# Patient Record
Sex: Male | Born: 1990 | Race: Black or African American | Hispanic: No | Marital: Single | Smoking: Former smoker
Health system: Southern US, Community
[De-identification: ages and names within clinical notes are randomized; demographics above are authoritative.]

---
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
## 2014-02-03 ENCOUNTER — Emergency Department (HOSPITAL_COMMUNITY)
Admission: EM | Admit: 2014-02-03 | Discharge: 2014-02-04 | Disposition: A | Discharge status: Home / Self Care | Payer: Medicaid Other | Attending: Emergency Medicine | Admitting: Emergency Medicine

## 2014-02-03 ENCOUNTER — Encounter (HOSPITAL_COMMUNITY): Payer: Self-pay | Admitting: *Deleted

## 2014-02-03 DIAGNOSIS — R067 Sneezing: Secondary | ICD-10-CM | POA: Insufficient documentation

## 2014-02-03 DIAGNOSIS — Z720 Tobacco use: Secondary | ICD-10-CM | POA: Insufficient documentation

## 2014-02-03 DIAGNOSIS — R040 Epistaxis: Secondary | ICD-10-CM | POA: Insufficient documentation

## 2014-02-03 DIAGNOSIS — R0981 Nasal congestion: Secondary | ICD-10-CM | POA: Insufficient documentation

## 2014-02-03 NOTE — ED Notes (Signed)
Pt came to Ed in pov. Pt c/o nosebleed. Pt states started around 1000 today and has been intermittently bleeding throughout the day.

## 2014-02-04 MED ORDER — OXYMETAZOLINE HCL 0.05 % NA SOLN
1.0000 | Freq: Once | NASAL | Status: AC
  Administered 2014-02-04: 1 via NASAL
  Filled 2014-02-04: qty 15

## 2014-02-04 NOTE — ED Provider Notes (Signed)
CSN: [id number]     Arrival date & time 02/03/14  2315 History   First MD Initiated Contact with Patient 02/03/14 2356     Chief Complaint  Patient presents with  . Epistaxis    (Consider location/radiation/quality/duration/timing/severity/associated sxs/prior Treatment) Patient is a 23 y.o. male presenting with nosebleeds. The history is provided by the patient. No language interpreter was used.  Epistaxis Location:  L nare Severity:  Moderate Duration:  1 day Timing:  Intermittent Progression:  Waxing and waning Chronicity:  New Context: not anticoagulants, not aspirin use, not bleeding disorder, not foreign body and not nose picking   Context comment:  Recent URI symptoms with frequent nose blowing Relieved by: Tissues held at base of nose. Worsened by:  Sneezing Associated symptoms: blood in oropharynx, congestion and sneezing   Associated symptoms: no cough, no dizziness, no fever, no sinus pain, no sore throat and no syncope     No past medical history on file. No past surgical history on file. No family history on file. History  Substance Use Topics  . Smoking status: Current Every Day Smoker    Types: Cigarettes  . Smokeless tobacco: Never Used  . Alcohol Use: No    Review of Systems  Constitutional: Negative for fever.  HENT: Positive for congestion, nosebleeds and sneezing. Negative for sore throat.   Respiratory: Negative for cough.   Cardiovascular: Negative for syncope.  Neurological: Negative for dizziness.  All other systems reviewed and are negative.   Allergies  Review of patient's allergies indicates no known allergies.  Home Medications   Prior to Admission medications   Not on File   BP 119/76 mmHg  Pulse 85  Temp(Src) 98 F (36.7 C) (Oral)  Resp 12  SpO2 96%   Physical Exam  Constitutional: He is oriented to person, place, and time. He appears well-developed and well-nourished. No distress.  Nontoxic/nonseptic appearing  HENT:   Head: Normocephalic and atraumatic.  Bilateral nares patent. No septal deviation or hematoma. There is mild maceration noted to the medial aspect of the left nare. No active bleeding. Mild amount of blood in oropharynx.  Eyes: Conjunctivae and EOM are normal. No scleral icterus.  Neck: Normal range of motion.  Pulmonary/Chest: Effort normal. No respiratory distress.  Respirations even and unlabored  Musculoskeletal: Normal range of motion.  Neurological: He is alert and oriented to person, place, and time. He exhibits normal muscle tone. Coordination normal.  Skin: Skin is warm and dry. No rash noted. He is not diaphoretic. No erythema. No pallor.  Psychiatric: He has a normal mood and affect. His behavior is normal.  Nursing note and vitals reviewed.   ED Course  Procedures (including critical care time) Labs Review Labs Reviewed - No data to display  Imaging Review No results found.   EKG Interpretation None      MDM   Final diagnoses:  Epistaxis    23 year old male presents to the emergency department for further evaluation of nosebleed. Patient states that he has had multiple nosebleeds over the course of the day. He reports preceding symptoms of nasal congestion and frequent sneezing. Patient has no nosebleed at present. He has been monitored in the ED for over 1 hour with no recurrence of bleed. Physical exam findings as above. No septal deviation or hematoma. Bilateral nares patent. Patient treated with Afrin and has been provided instruction on how to manage future nosebleeds. Will refer to ENT if symptoms persist or worsen. Return precautions provided and patient agreeable  to plan with no unaddressed concerns.   Filed Vitals:   02/03/14 2321 02/04/14 0037  BP: 140/94 119/76  Pulse: 99 85  Temp: 98 F (36.7 C)   TempSrc: Oral   Resp: 14 12  SpO2: 97% 96%     Antony MaduraKelly Finis Hendricksen, PA-C 02/04/14 [id number]  Gerhard Munchobert Lockwood, MD 02/04/14 [phone]

## 2014-02-04 NOTE — Discharge Instructions (Signed)
Recommend that you use Afrin nasal spray once a day for the next 2 days (02/05/14 and 02/06/14). Your first dose was given in the ED today. Follow instructions below for future nosebleeds including 10 minutes of CONSTANT pressure with tilting of the head forward. Follow up with an ENT doctor if symptoms persist or worsen.  Nosebleed Nosebleeds can be caused by many conditions, including trauma, infections, polyps, foreign bodies, dry mucous membranes or climate, medicines, and air conditioning. Most nosebleeds occur in the front of the nose. Because of this location, most nosebleeds can be controlled by pinching the nostrils gently and continuously for at least 10 to 20 minutes. The long, continuous pressure allows enough time for the blood to clot. If pressure is released during that 10 to 20 minute time period, the process may have to be started again. The nosebleed may stop by itself or quit with pressure, or it may need concentrated heating (cautery) or pressure from packing. HOME CARE INSTRUCTIONS   If your nose was packed, try to maintain the pack inside until your health care provider removes it. If a gauze pack was used and it starts to fall out, gently replace it or cut the end off. Do not cut if a balloon catheter was used to pack the nose. Otherwise, do not remove unless instructed.  Avoid blowing your nose for 12 hours after treatment. This could dislodge the pack or clot and start the bleeding again.  If the bleeding starts again, sit up and bend forward, gently pinching the front half of your nose continuously for 20 minutes.  If bleeding was caused by dry mucous membranes, use over-the-counter saline nasal spray or gel. This will keep the mucous membranes moist and allow them to heal. If you must use a lubricant, choose the water-soluble variety. Use it only sparingly and not within several hours of lying down.  Do not use petroleum jelly or mineral oil, as these may drip into the lungs and  cause serious problems.  Maintain humidity in your home by using less air conditioning or by using a humidifier.  Do not use aspirin or medicines which make bleeding more likely. Your health care provider can give you recommendations on this.  Resume normal activities as you are able, but try to avoid straining, lifting, or bending at the waist for several days.  If the nosebleeds become recurrent and the cause is unknown, your health care provider may suggest laboratory tests. SEEK MEDICAL CARE IF: You have a fever. SEEK IMMEDIATE MEDICAL CARE IF:   Bleeding recurs and cannot be controlled.  There is unusual bleeding from or bruising on other parts of the body.  Nosebleeds continue.  There is any worsening of the condition which originally brought you in.  You become light-headed, feel faint, become sweaty, or vomit blood. MAKE SURE YOU:   Understand these instructions.  Will watch your condition.  Will get help right away if you are not doing well or get worse. Document Released: 11/01/2004 Document Revised: 06/08/2013 Document Reviewed: 12/23/2008 [facility] Patient Information 2015 [place], [place]. This information is not intended to replace advice given to you by your health care provider. Make sure you discuss any questions you have with your health care provider.

## 2014-09-30 ENCOUNTER — Encounter (HOSPITAL_COMMUNITY): Payer: Self-pay

## 2014-09-30 ENCOUNTER — Encounter (HOSPITAL_COMMUNITY): Payer: Self-pay | Admitting: Emergency Medicine

## 2014-09-30 ENCOUNTER — Emergency Department (HOSPITAL_COMMUNITY)
Admission: EM | Admit: 2014-09-30 | Discharge: 2014-09-30 | Disposition: A | Discharge status: Home / Self Care | Payer: Medicaid Other | Attending: Emergency Medicine | Admitting: Emergency Medicine

## 2014-09-30 DIAGNOSIS — Z720 Tobacco use: Secondary | ICD-10-CM | POA: Insufficient documentation

## 2014-09-30 DIAGNOSIS — Z87891 Personal history of nicotine dependence: Secondary | ICD-10-CM | POA: Insufficient documentation

## 2014-09-30 DIAGNOSIS — L0231 Cutaneous abscess of buttock: Secondary | ICD-10-CM | POA: Insufficient documentation

## 2014-09-30 MED ORDER — OXYCODONE-ACETAMINOPHEN 5-325 MG PO TABS
1.0000 | ORAL_TABLET | Freq: Once | ORAL | Status: AC
  Administered 2014-09-30: 1 via ORAL
  Filled 2014-09-30: qty 1

## 2014-09-30 MED ORDER — TETANUS-DIPHTH-ACELL PERTUSSIS 5-2.5-18.5 LF-MCG/0.5 IM SUSP
0.5000 mL | Freq: Once | INTRAMUSCULAR | Status: AC
  Administered 2014-09-30: 0.5 mL via INTRAMUSCULAR
  Filled 2014-09-30: qty 0.5

## 2014-09-30 MED ORDER — SULFAMETHOXAZOLE-TRIMETHOPRIM 800-160 MG PO TABS
1.0000 | ORAL_TABLET | Freq: Two times a day (BID) | ORAL | Status: AC
Start: 2014-09-30 — End: 2014-10-07

## 2014-09-30 MED ORDER — OXYCODONE-ACETAMINOPHEN 5-325 MG PO TABS: 1.0000 | ORAL_TABLET | Freq: Four times a day (QID) | ORAL | Status: DC | PRN

## 2014-09-30 MED ORDER — CEPHALEXIN 500 MG PO CAPS: 1000.0000 mg | ORAL_CAPSULE | Freq: Two times a day (BID) | ORAL | Status: DC

## 2014-09-30 MED ORDER — HYDROCODONE-ACETAMINOPHEN 5-325 MG PO TABS
1.0000 | ORAL_TABLET | Freq: Once | ORAL | Status: AC
  Administered 2014-09-30: 1 via ORAL
  Filled 2014-09-30: qty 1

## 2014-09-30 MED ORDER — LIDOCAINE-EPINEPHRINE 2 %-1:100000 IJ SOLN
10.0000 mL | Freq: Once | INTRAMUSCULAR | Status: AC
  Administered 2014-09-30: 10 mL via INTRADERMAL
  Filled 2014-09-30: qty 1

## 2014-09-30 NOTE — ED Notes (Signed)
Pt c/o increasing abscess on bilateral medial buttocks x 5 days.  Pain score 10/10. Redness and swelling noted.

## 2014-09-30 NOTE — ED Notes (Addendum)
Verbalized understanding discharge instructions and follow-up. In no acute distress.  Pt given wound care supplies.    

## 2014-09-30 NOTE — ED Provider Notes (Signed)
CSN: [id number]     Arrival date & time 09/30/14  1121 History   First MD Initiated Contact with Patient 09/30/14 1123     No chief complaint on file.    (Consider location/radiation/quality/duration/timing/severity/associated sxs/prior Treatment) HPI   24 year old male presenting with complaint of pain and swelling to his buttock. Patient noticed increasing pain and swelling to his gluteal cleft approximately 5 days ago. Symptoms progressively getting worse. Described pain as as sharp throbbing sensation, persistent, worsening with sitting. He has tried over-the-counter pain medication with minimal movement. Rates his pain as 10 out of 10. He has never had this problem before. Denies any specific injury. Denies having fever,rectal pain, difficulty having bowel movement aside from the discomfort. He is not up-to-date with tetanus. No prior history of pilonidal cyst.  No past medical history on file. No past surgical history on file. No family history on file. Social History  Substance Use Topics  . Smoking status: Current Every Day Smoker    Types: Cigarettes  . Smokeless tobacco: Never Used  . Alcohol Use: No    Review of Systems  Constitutional: Negative for fever.  Gastrointestinal: Negative for abdominal pain and rectal pain.  Skin: Positive for rash.  Neurological: Negative for numbness.      Allergies  Review of patient's allergies indicates no known allergies.  Home Medications   Prior to Admission medications   Not on File   There were no vitals taken for this visit. Physical Exam  Constitutional: He appears well-developed and well-nourished. No distress.  HENT:  Head: Atraumatic.  Eyes: Conjunctivae are normal.  Neck: Neck supple.  Abdominal: There is no tenderness.  Genitourinary:  Chaperone present during exam.  A large area of induration with fluctuance and surrounding skin erythema noted to gluteal cleft L>R.  Exquisite tenderness to palpation.  No  evidence of forniere gangrene.  No rectal involvement  Neurological: He is alert.  Psychiatric: He has a normal mood and affect.  Nursing note and vitals reviewed.   ED Course  Procedures (including critical care time)  Large gluteal cleft ?pilonidal cyst abscess requiring I&D.  No rectal involvement.  tdap update.  I&D performed by me, pt tolerates well.  INCISION AND DRAINAGE Performed by: Fayrene Helper Consent: Verbal consent obtained. Risks and benefits: risks, benefits and alternatives were discussed Type: abscess  Body area: gluteal cleft (?pilonidal cyst) affecting L buttock more than R.  Anesthesia: local infiltration  Incision was made with a scalpel.  Local anesthetic: lidocaine 2% w epinephrine  Anesthetic total: 8 ml  Complexity: complex Blunt dissection to break up loculations  Drainage: purulent  Drainage amount: large  Packing material: 1/4 in iodoform gauze  Patient tolerance: Patient tolerated the procedure well with no immediate complications.   12:21 PM Pt instruct to use sitz bath, take abx and pain medication as prescribed.  Pt will f/u in 2 days for wound recheck and packing removal.  Pt understand to return promptly if sxs worse, having rectal bleeding, or having any other concerns.  Labs Review Labs Reviewed - No data to display  Imaging Review No results found. I have personally reviewed and evaluated these images and lab results as part of my medical decision-making.   EKG Interpretation None      MDM   Final diagnoses:  Abscess of gluteal cleft    BP 122/76 mmHg  Pulse 91  Temp(Src) 99.4 F (37.4 C) (Oral)  Resp 16  SpO2 96%     Fayrene Helper, PA-C  09/30/14 1225  Azalia Bilis, MD 09/30/14 [phone]

## 2014-09-30 NOTE — ED Notes (Signed)
Per EMS, Pt, from home, c/o buttocks pain after having an abscess drained earlier today.  Pain score 10/10.  Pt reported to EMS that he was unable to get his prescriptions filled.

## 2014-09-30 NOTE — Discharge Instructions (Signed)
Please fill your medications as prescribed previously and take it as recommended.   Abscess Care After An abscess (also called a boil or furuncle) is an infected area that contains a collection of pus. Signs and symptoms of an abscess include pain, tenderness, redness, or hardness, or you may feel a moveable soft area under your skin. An abscess can occur anywhere in the body. The infection may spread to surrounding tissues causing cellulitis. A cut (incision) by the surgeon was made over your abscess and the pus was drained out. Gauze may have been packed into the space to provide a drain that will allow the cavity to heal from the inside outwards. The boil may be painful for 5 to 7 days. Most people with a boil do not have high fevers. Your abscess, if seen early, may not have localized, and may not have been lanced. If not, another appointment may be required for this if it does not get better on its own or with medications. HOME CARE INSTRUCTIONS   Only take over-the-counter or prescription medicines for pain, discomfort, or fever as directed by your caregiver.  When you bathe, soak and then remove gauze or iodoform packs at least daily or as directed by your caregiver. You may then wash the wound gently with mild soapy water. Repack with gauze or do as your caregiver directs. SEEK IMMEDIATE MEDICAL CARE IF:   You develop increased pain, swelling, redness, drainage, or bleeding in the wound site.  You develop signs of generalized infection including muscle aches, chills, fever, or a general ill feeling.  An oral temperature above 102 F (38.9 C) develops, not controlled by medication. See your caregiver for a recheck if you develop any of the symptoms described above. If medications (antibiotics) were prescribed, take them as directed. Document Released: 08/10/2004 Document Revised: 04/16/2011 Document Reviewed: 04/07/2007 [facility] Patient Information 2015 [place], [place]. This information is  not intended to replace advice given to you by your health care provider. Make sure you discuss any questions you have with your health care provider.

## 2014-09-30 NOTE — Progress Notes (Signed)
24 yr old male with constant and severe pain at the gluteal cleft after having an abscess drained this morning. He was not able to fill his Percocet or abx prescriptions due to financial constraint and lack of transportation Pt was listed with medicaid but finally verified by ED registration that he did not have medicaid and is uninsured CM consulted by ED RN and charge RN to assist with medications CM reviewed pt discharge AVS to offer pt resources from [employer] and [employer] to get pt total cost he states he is able to afford at $20-21 Cm helped pt coordinate with use of goodrx for discounts for percocet at $10 at [facility], keflex $6.36 at [facility] and to [employer] for septra on 14 day free antibiotic list   CM discussed and provided written information for uninsured accepting pcps, discussed the importance of pcp vs EDP services for f/u care, www.needymeds.org, www.goodrx.com, discounted pharmacies and other [employer] such as [facility] [facility] [facility] , [employer], affordable care act, financial assistance, uninsured dental services, [place] med assist, DSS and  health department  Reviewed resources for [employer] uninsured accepting pcps like [facility], family medicine at [facility], [facility], [facility], local urgent care centers, [facility], [facility], general medical clinics, family services of the [place], [facility] urgent care plus others, medication resources, [facility] out patient pharmacies and housing Pt voiced understanding and appreciation of resources provided   Provided [facility] contact information Pt agreed to a referral Cm completed referral Pt to be contact by [facility] clinical liason

## 2014-09-30 NOTE — Discharge Instructions (Signed)
°  Please apply warm compress or warm bath over the abscess several times daily to help facilitate drainage. Take antibiotic and pain medication as prescribed.  Follow up in 2 days at Urgent Care for wound recheck and packing removal.  Return promptly if symptoms worsen, having rectal bleeding, or having any other concerns.  Abscess Care After An abscess (also called a boil or furuncle) is an infected area that contains a collection of pus. Signs and symptoms of an abscess include pain, tenderness, redness, or hardness, or you may feel a moveable soft area under your skin. An abscess can occur anywhere in the body. The infection may spread to surrounding tissues causing cellulitis. A cut (incision) by the surgeon was made over your abscess and the pus was drained out. Gauze may have been packed into the space to provide a drain that will allow the cavity to heal from the inside outwards. The boil may be painful for 5 to 7 days. Most people with a boil do not have high fevers. Your abscess, if seen early, may not have localized, and may not have been lanced. If not, another appointment may be required for this if it does not get better on its own or with medications. HOME CARE INSTRUCTIONS   Only take over-the-counter or prescription medicines for pain, discomfort, or fever as directed by your caregiver.  When you bathe, soak and then remove gauze or iodoform packs at least daily or as directed by your caregiver. You may then wash the wound gently with mild soapy water. Repack with gauze or do as your caregiver directs. SEEK IMMEDIATE MEDICAL CARE IF:   You develop increased pain, swelling, redness, drainage, or bleeding in the wound site.  You develop signs of generalized infection including muscle aches, chills, fever, or a general ill feeling.  An oral temperature above 102 F (38.9 C) develops, not controlled by medication. See your caregiver for a recheck if you develop any of the symptoms  described above. If medications (antibiotics) were prescribed, take them as directed. Document Released: 08/10/2004 Document Revised: 04/16/2011 Document Reviewed: 04/07/2007 [facility] Patient Information 2015 [place], [place]. This information is not intended to replace advice given to you by your health care provider. Make sure you discuss any questions you have with your health care provider.

## 2014-09-30 NOTE — ED Provider Notes (Signed)
CSN: [id number]   Arrival date & time 09/30/14 1514  History  This chart was scribed for non-physician practitioner, Fayrene Helper PA-C , working with Doug Sou, MD by Bethel Born, ED Scribe. This patient was seen in room WTR8/WTR8 and the patient's care was started at 3:36 PM.  Chief Complaint  Patient presents with  . Post Procedure Pain     HPI The history is provided by the patient. No language interpreter was used.   Ryan Orr is a 24 y.o. male who presents to the Emergency Department complaining of constant and severe pain at the gluteal cleft after having an abscess drained this morning. He was not able to fill his Percocet or abx prescriptions due to financial constraint and lack of transportation.  Otherwise no other complaint.  Pain is at the incision site only.  No rectal complaint.  No past medical history on file.  No past surgical history on file.  No family history on file.  Social History  Substance Use Topics  . Smoking status: Former Games developer  . Smokeless tobacco: Never Used  . Alcohol Use: No     Review of Systems  Constitutional: Negative for fever.  Genitourinary:       Pain at the gluteal cleft  Neurological: Negative for numbness.    Home Medications   Prior to Admission medications   Medication Sig Start Date End Date Taking? Authorizing Provider  cephALEXin (KEFLEX) 500 MG capsule Take 2 capsules (1,000 mg total) by mouth 2 (two) times daily. 09/30/14   Fayrene Helper, PA-C  oxyCODONE-acetaminophen (PERCOCET/ROXICET) 5-325 MG per tablet Take 1 tablet by mouth every 6 (six) hours as needed for severe pain. 09/30/14   Fayrene Helper, PA-C  sulfamethoxazole-trimethoprim (BACTRIM DS,SEPTRA DS) 800-160 MG per tablet Take 1 tablet by mouth 2 (two) times daily. 09/30/14 10/07/14  Fayrene Helper, PA-C    Allergies  Review of patient's allergies indicates no known allergies.  Triage Vitals: BP 120/63 mmHg  Pulse 94  Temp(Src) 99.6 F (37.6 C) (Oral)  Resp 16   SpO2 99%  Physical Exam  Constitutional: He is oriented to person, place, and time. He appears well-developed and well-nourished.  HENT:  Head: Normocephalic.  Eyes: EOM are normal.  Neck: Normal range of motion.  Pulmonary/Chest: Effort normal.  Abdominal: He exhibits no distension.  Genitourinary:  Gluteal cleft with moderate TTP, surgical incision with normal appearance, packing in place  Musculoskeletal: Normal range of motion.  Neurological: He is alert and oriented to person, place, and time.  Psychiatric: He has a normal mood and affect.  Nursing note and vitals reviewed.   ED Course  Procedures  DIAGNOSTIC STUDIES: Oxygen Saturation is 99% on RA,  normal by my interpretation.    COORDINATION OF CARE: 3:39 PM pt return because he cannot afford his medication and report of pain at incision site.  Pain medication given.  i have request social worker to see pt and help with medication filled. Discussed treatment plan which includes Percocet/Roxicet with pt at bedside and pt agreed to plan.  EKG Interpretation None    MDM   Final diagnoses:  Gluteal abscess   BP 120/63 mmHg  Pulse 94  Temp(Src) 99.6 F (37.6 C) (Oral)  Resp 16  SpO2 99%   I personally performed the services described in this documentation, which was scribed in my presence. The recorded information has been reviewed and is accurate.       Fayrene Helper, PA-C 09/30/14 1607  Sam Ethelda Chick,  MD 09/30/14 1610

## 2014-10-04 ENCOUNTER — Encounter (HOSPITAL_COMMUNITY): Payer: Self-pay | Admitting: Emergency Medicine

## 2014-10-04 ENCOUNTER — Emergency Department (HOSPITAL_COMMUNITY)
Admission: EM | Admit: 2014-10-04 | Discharge: 2014-10-04 | Disposition: A | Discharge status: Home / Self Care | Payer: Medicaid Other | Attending: Emergency Medicine | Admitting: Emergency Medicine

## 2014-10-04 DIAGNOSIS — Z792 Long term (current) use of antibiotics: Secondary | ICD-10-CM | POA: Insufficient documentation

## 2014-10-04 DIAGNOSIS — Z4801 Encounter for change or removal of surgical wound dressing: Secondary | ICD-10-CM | POA: Insufficient documentation

## 2014-10-04 DIAGNOSIS — Z87891 Personal history of nicotine dependence: Secondary | ICD-10-CM | POA: Insufficient documentation

## 2014-10-04 DIAGNOSIS — L0231 Cutaneous abscess of buttock: Secondary | ICD-10-CM

## 2014-10-04 NOTE — ED Notes (Signed)
Patient is at ED to have his packing removed from abscess.  He was here 09/30/14 to have abscess drained.  He denies any pain.  No fever, n/v or chills.

## 2014-10-04 NOTE — ED Provider Notes (Signed)
CSN: [id number]     Arrival date & time 10/04/14  1619 History   First MD Initiated Contact with Patient 10/04/14 1622     No chief complaint on file.   HPI  [name] is a 24 year old male presenting for abscess follow up. Pt had large gluteal cleft abscess drained 4 days ago and packed. Here today to have packing removed. Denies fevers, chills, worsening pain of the area, rectal bleeding or any other concerns. Reports compliance with his antibiotics.   History reviewed. No pertinent past medical history. History reviewed. No pertinent past surgical history. No family history on file. Social History  Substance Use Topics  . Smoking status: Former Games developer  . Smokeless tobacco: Never Used  . Alcohol Use: No    Review of Systems  Constitutional: Negative for fever, chills and diaphoresis.  Gastrointestinal: Negative for nausea, vomiting, diarrhea, constipation, blood in stool, anal bleeding and rectal pain.  Musculoskeletal: Negative for myalgias.  Skin: Positive for wound.      Allergies  Review of patient's allergies indicates no known allergies.  Home Medications   Prior to Admission medications   Medication Sig Start Date End Date Taking? Authorizing Provider  cephALEXin (KEFLEX) 500 MG capsule Take 2 capsules (1,000 mg total) by mouth 2 (two) times daily. 09/30/14  Yes Fayrene Helper, PA-C  oxyCODONE-acetaminophen (PERCOCET/ROXICET) 5-325 MG per tablet Take 1 tablet by mouth every 6 (six) hours as needed for severe pain. 09/30/14  Yes Fayrene Helper, PA-C  sulfamethoxazole-trimethoprim (BACTRIM DS,SEPTRA DS) 800-160 MG per tablet Take 1 tablet by mouth 2 (two) times daily. Patient not taking: Reported on 10/04/2014 09/30/14 10/07/14  Fayrene Helper, PA-C   BP 117/71 mmHg  Pulse 74  Temp(Src) 98.5 F (36.9 C) (Oral)  Resp 18  SpO2 96% Physical Exam  Constitutional: He is oriented to person, place, and time. He appears well-developed and well-nourished. No distress.  HENT:  Head:  Normocephalic and atraumatic.  Neck: Normal range of motion.  Cardiovascular: Normal rate.   Pulmonary/Chest: Effort normal.  Neurological: He is alert and oriented to person, place, and time.  Skin: Skin is warm and dry.  Well healing gluteal abscess with packing in place. Induration surrounding incision site. No erythema. Scant amounts of purulent drainage from site. Mildly TTP.  Psychiatric: He has a normal mood and affect. His behavior is normal.    ED Course  Procedures (including critical care time) Labs Review Labs Reviewed - No data to display  Imaging Review No results found. I have personally reviewed and evaluated these images and lab results as part of my medical decision-making.   EKG Interpretation None     Removed packing from gluteal abscess. Pt tolerated well.  MDM   Final diagnoses:  None   Abscess follow up Pt presenting for packing removal from gluteal abscess. Denies fevers, chills, increasing pain, rectal bleeding, or further worsening of symptoms. Removed packing, pt tolerated it well. No signs of local infection. Small amount of induration surrounding wound site. Instructed pt to continue antibiotics and pain medicine as needed. Keep wound clean and covered until healed. Pt agrees with this plan.     Alveta Heimlich, PA-C 10/04/14 1715  Tilden Fossa, MD 10/05/14 Burna Mortimer

## 2014-10-04 NOTE — Discharge Instructions (Signed)
-   Continue taking your antibiotics - Take pain medicine as needed - Keep area clean and covered with a bandage until fully healed - Return to ED with fevers, chills, increasing pain at wound site, redness at wound site, rectal bleeding or further worsening of symptoms

## 2015-01-14 ENCOUNTER — Emergency Department (HOSPITAL_COMMUNITY)
Admission: EM | Admit: 2015-01-14 | Discharge: 2015-01-14 | Disposition: A | Discharge status: Home / Self Care | Payer: Medicaid Other | Attending: Emergency Medicine | Admitting: Emergency Medicine

## 2015-01-14 ENCOUNTER — Encounter (HOSPITAL_COMMUNITY): Payer: Self-pay

## 2015-01-14 DIAGNOSIS — Y998 Other external cause status: Secondary | ICD-10-CM | POA: Insufficient documentation

## 2015-01-14 DIAGNOSIS — Y9289 Other specified places as the place of occurrence of the external cause: Secondary | ICD-10-CM | POA: Insufficient documentation

## 2015-01-14 DIAGNOSIS — Y9389 Activity, other specified: Secondary | ICD-10-CM | POA: Insufficient documentation

## 2015-01-14 DIAGNOSIS — X58XXXA Exposure to other specified factors, initial encounter: Secondary | ICD-10-CM | POA: Insufficient documentation

## 2015-01-14 DIAGNOSIS — Z87891 Personal history of nicotine dependence: Secondary | ICD-10-CM | POA: Insufficient documentation

## 2015-01-14 DIAGNOSIS — T162XXA Foreign body in left ear, initial encounter: Secondary | ICD-10-CM

## 2015-01-14 MED ORDER — NEOMYCIN-POLYMYXIN-HC 3.5-10000-1 OT SUSP: 4.0000 [drp] | Freq: Four times a day (QID) | OTIC | Status: DC

## 2015-01-14 MED ORDER — CARBAMIDE PEROXIDE 6.5 % OT SOLN
5.0000 [drp] | Freq: Once | OTIC | Status: AC
  Administered 2015-01-14: 5 [drp] via OTIC
  Filled 2015-01-14: qty 15

## 2015-01-14 MED ORDER — HYDROGEN PEROXIDE 3 % EX SOLN
CUTANEOUS | Status: AC
  Filled 2015-01-14: qty 473

## 2015-01-14 NOTE — ED Notes (Signed)
Bed: WA24 Expected date:  Expected time:  Means of arrival:  Comments: EMS 

## 2015-01-14 NOTE — Discharge Instructions (Signed)
See the ENT physician today.   Ear Foreign Body An ear foreign body is an object that is stuck in your ear. The object is usually stuck in the ear canal. CAUSES In all ages of people, the most common foreign bodies are insects that enter the ear canal. It is common for young children to put objects into the ear canal. These may include pebbles, beads, parts of toys, and any other small objects that fit into the ear. In adults, objects such as cotton swabs may become lodged in the ear canal.  SIGNS AND SYMPTOMS A foreign body in the ear may cause:  Pain.  Buzzing or roaring sounds.  Hearing loss.  Ear drainage or bleeding.  Nausea and vomiting.  A feeling that your ear is full. DIAGNOSIS Your health care provider may be able to diagnose an ear foreign body based on the information that you provide, your symptoms, and a physical exam. Your health care provider may also perform tests, such as testing your hearing and your ear pressure, to check for infection or other problems that are caused by the foreign body in your ear. TREATMENT Treatment depends on what the foreign body is, the location of the foreign body in your ear, and whether or not the foreign body has injured any part of your inner ear. If the foreign body is visible to your health care provider, it may be possible to remove the foreign body using:  A tool, such as medical tweezers (forceps) or a suction tube (catheter).  Irrigation. This uses water to flush the foreign body out of your ear. This is used only if the foreign body is not likely to swell or enlarge when it is put in water. If the foreign body is not visible or your health care provider was not able to remove the foreign body, you may be referred to a specialist for removal. You may also be prescribed antibiotic medicine or ear drops to prevent infection. If the foreign body has caused injury to other parts of your ear, you may need additional treatment. HOME CARE  INSTRUCTIONS  Keep all follow-up visits as directed by your health care provider. This is important.  Take medicines only as directed by your health care provider.  If you were prescribed an antibiotic medicine, finish it all even if you start to feel better. PREVENTION  Keep small objects out of reach of young children. Tell children not to put anything in their ears.  Do not put anything in your ear, including cotton swabs, to clean your ears. Talk to your health care provider about how to clean your ears safely. SEEK MEDICAL CARE IF:  You have a headache.  Your have blood coming from your ear.  You have a fever.  You have increased pain or swelling of your ear.  Your hearing is reduced.  You have discharge coming from your ear.   This information is not intended to replace advice given to you by your health care provider. Make sure you discuss any questions you have with your health care provider.   Document Released: 01/20/2000 Document Revised: 02/12/2014 Document Reviewed: 09/07/2013 Elsevier Interactive Patient Education 2016 [employer].  Hydrocortisone; Neomycin; Polymyxin B ear suspension What is this medicine? HYDROCORTISONE; NEOMYCIN; and POLYMYXIN B (hye droe KOR ti sone; nee oh MYE sin; pol i MIX in B) is used to treat ear infections. This medicine may be used for other purposes; ask your health care provider or pharmacist if you  have questions. What should I tell my health care provider before I take this medicine? They need to know if you have any of these conditions: -any other active infections -chronic ear infections or fluid in the ear -perforated ear drum -an unusual or allergic reaction to hydrocortisone, neomycin, polymyxin B, sulfites, other medicines, foods, dyes, or preservatives -pregnant or trying to get pregnant -breast-feeding How should I use this medicine? This medicine is only for use in the ears. Wash your hands with soap and water.  Clean your ear of any fluid that can be easily removed. Do not insert any object or swab into the ear canal. Gently warm the bottle by holding it in the hand for 1 to 2 minutes. Lie down on your side with the infected ear up. Try not to touch the tip of the dropper to your ear, fingertips, or other surface. Shake the bottle immediately before using. Squeeze the bottle gently to put the prescribed number of drops in the ear canal. Stay in this position for 30 to 60 seconds to help the drops soak into the ear. Repeat the steps for the other ear if both ears are infected. Do not use your medicine more often than directed. Finish the full course of medicine prescribed by your doctor or health care professional even if you think your condition is better. Talk to your pediatrician regarding the use of this medicine in children. While this drug may be prescribed for selected conditions, precautions do apply. Overdosage: If you think you have taken too much of this medicine contact a poison control center or emergency room at once. NOTE: This medicine is only for you. Do not share this medicine with others. What if I miss a dose? If you miss a dose, use it as soon as you can. If it is almost time for your next dose, use only that dose. Do not take double or extra doses. What may interact with this medicine? Interactions are not expected. Do not use other ear products without talking to your doctor or health care professional. This list may not describe all possible interactions. Give your health care provider a list of all the medicines, herbs, non-prescription drugs, or dietary supplements you use. Also tell them if you smoke, drink alcohol, or use illegal drugs. Some items may interact with your medicine. What should I watch for while using this medicine? Tell your doctor or health care professional if your ear infection does not get better in a few days. Do not use longer than 10 days unless instructed by your  doctor or health care professional. If rash or allergic reaction occurs, stop the product immediately and contact your physician. It is important that you keep the infected ear(s) clean and dry. When bathing, try not to get the infected ear(s) wet. Do not go swimming unless your doctor or health care professional has told you otherwise. To prevent the spread of infection, do not share ear products, or share towels and washcloths with anyone else. What side effects may I notice from receiving this medicine? Side effects that you should report to your doctor or health care professional as soon as possible: -rash -red, itchy, dry scaly skin at the affected site -worsening ear pain Side effects that usually do not require medical attention (report to your doctor or health care professional if they continue or are bothersome): -abnormal sensation in the ear -burning or stinging while putting the drops in the ear This list may not describe all  possible side effects. Call your doctor for medical advice about side effects. You may report side effects to FDA at 1-800-FDA-1088. Where should I keep my medicine? Keep out of the reach of children. Store at room temperature between 15 and 25 degrees C (59 and 77 degrees F). Do not freeze. Throw away any unused medicine after the expiration date. NOTE: This sheet is a summary. It may not cover all possible information. If you have questions about this medicine, talk to your doctor, pharmacist, or health care provider.    2016, Elsevier/Gold Standard. (2014-07-13 15:07:57)

## 2015-01-14 NOTE — ED Provider Notes (Signed)
CSN: [id number]     Arrival date & time 01/14/15  4098 History  By signing my name below, I, Gonzella Lex, attest that this documentation has been prepared under the direction and in the presence of Dione Booze, MD. Electronically Signed: Gonzella Lex, Scribe. 01/14/2015. 3:49 AM.    Chief Complaint  Patient presents with  . Foreign Body in Ear    The history is provided by the patient. No language interpreter was used.    HPI Comments: Ryan Orr is a 24 y.o. male who presents to the Emergency Department complaining of a foreign body in his left ear which woke him up out of his sleep late this morning. Pt states the foreign object is a bug. He notes that he tried getting the foreign object out of his ear with a q-tip and with his finger. Pt has no other complaints at this time.  History reviewed. No pertinent past medical history. History reviewed. No pertinent past surgical history. History reviewed. No pertinent family history. Social History  Substance Use Topics  . Smoking status: Former Games developer  . Smokeless tobacco: Never Used  . Alcohol Use: No    Review of Systems  HENT: Positive for ear pain ( foreign body present in left ear).   All other systems reviewed and are negative.   Allergies  Review of patient's allergies indicates no known allergies.  Home Medications   Prior to Admission medications   Medication Sig Start Date End Date Taking? Authorizing Provider  cephALEXin (KEFLEX) 500 MG capsule Take 2 capsules (1,000 mg total) by mouth 2 (two) times daily. Patient not taking: Reported on 01/14/2015 09/30/14   Fayrene Helper, PA-C  oxyCODONE-acetaminophen (PERCOCET/ROXICET) 5-325 MG per tablet Take 1 tablet by mouth every 6 (six) hours as needed for severe pain. Patient not taking: Reported on 01/14/2015 09/30/14   Fayrene Helper, PA-C   BP 128/89 mmHg  Pulse 83  Temp(Src) 98.8 F (37.1 C) (Oral)  Resp 20  Ht  (1.753 m)  Wt 257 lb (116.574 kg)  BMI  37.93 kg/m2  SpO2 98% Physical Exam  Constitutional: He is oriented to person, place, and time. He appears well-developed and well-nourished. No distress.  Appears uncomfortable  HENT:  Head: Normocephalic.  Erythema of the left auditory canal with small foreign body present on the tympanic membrane   Eyes: EOM are normal. Pupils are equal, round, and reactive to light.  Neck: Normal range of motion. Neck supple. No JVD present.  Cardiovascular: Normal rate, regular rhythm and normal heart sounds.   No murmur heard. Pulmonary/Chest: Effort normal and breath sounds normal. He has no wheezes. He has no rales. He exhibits no tenderness.  Abdominal: Bowel sounds are normal. He exhibits no distension and no mass. There is no tenderness.  Musculoskeletal: Normal range of motion.  Lymphadenopathy:    He has no cervical adenopathy.  Neurological: He is alert and oriented to person, place, and time. No cranial nerve deficit. He exhibits normal muscle tone. Coordination normal.  Skin: Skin is warm and dry. No rash noted.  Psychiatric: He has a normal mood and affect. His behavior is normal. Judgment and thought content normal.  Nursing note and vitals reviewed.   ED Course  .Foreign Body Removal Date/Time: 01/14/2015 4:00 AM Performed by: Dione Booze Authorized by: Preston Fleeting, Jameisha Stofko Consent: Verbal consent obtained. Written consent not obtained. Risks and benefits: risks, benefits and alternatives were discussed Consent given by: patient Patient understanding: patient states understanding of  the procedure being performed Patient consent: the patient's understanding of the procedure matches consent given Procedure consent: procedure consent matches procedure scheduled Site marked: the operative site was marked Required items: required blood products, implants, devices, and special equipment available Patient identity confirmed: verbally with patient and arm band Time out: Immediately prior to  procedure a "time out" was called to verify the correct patient, procedure, equipment, support staff and site/side marked as required. Body area: ear Location details: left ear Anesthesia method: Auralgan solution. Patient sedated: no Patient restrained: no Patient cooperative: yes Removal mechanism: irrigation Complexity: simple 1 objects recovered. Objects recovered: frfagment of insect Post-procedure assessment: residual foreign bodies remain Patient tolerance: Patient tolerated the procedure well with no immediate complications    DIAGNOSTIC STUDIES:    Oxygen Saturation is 98% on RA, normal by my interpretation.   COORDINATION OF CARE:  3:50 AM Will numb area and retrieve foreign body. Discussed treatment plan with pt at bedside and pt agreed to plan.    MDM   Final diagnoses:  Foreign body in left ear, initial encounter    Foreign body in the left ear which apparently is an insect. The ears very inflamed from patient's attempt to remove it with a Q-tip. In the ED, the ear is anesthetized with Auralgan and ears irrigated. There was some fragments of an insect that were removed. There was also some mild bleeding. There was some debris on the tympanic membrane which I was not sure if it was blood clot or remains of an insect. With the degree of inflammation was present in the ear, it was decided not to pursue any further. He is discharged with prescription for Cortisporin otic suspension and is referred to ENT for further evaluation.  I personally performed the services described in this documentation, which was scribed in my presence. The recorded information has been reviewed and is accurate.       Dione Boozeavid Kajah Santizo, MD 01/14/15 [phone]

## 2015-01-14 NOTE — ED Notes (Signed)
Pt states he feels like something is crawling in his left ear that woke him up.

## 2017-03-05 ENCOUNTER — Emergency Department (HOSPITAL_COMMUNITY): Payer: Self-pay

## 2017-03-05 ENCOUNTER — Encounter (HOSPITAL_COMMUNITY): Payer: Self-pay | Admitting: Emergency Medicine

## 2017-03-05 ENCOUNTER — Other Ambulatory Visit: Payer: Self-pay

## 2017-03-05 ENCOUNTER — Emergency Department (HOSPITAL_COMMUNITY)
Admission: EM | Admit: 2017-03-05 | Discharge: 2017-03-06 | Disposition: A | Discharge status: Home / Self Care | Payer: Self-pay | Attending: Emergency Medicine | Admitting: Emergency Medicine

## 2017-03-05 DIAGNOSIS — R0789 Other chest pain: Secondary | ICD-10-CM | POA: Insufficient documentation

## 2017-03-05 DIAGNOSIS — Z87891 Personal history of nicotine dependence: Secondary | ICD-10-CM | POA: Insufficient documentation

## 2017-03-05 LAB — BASIC METABOLIC PANEL
Anion gap: 10 (ref 5–15)
BUN: 15 mg/dL (ref 6–20)
CALCIUM: 9.8 mg/dL (ref 8.9–10.3)
CO2: 24 mmol/L (ref 22–32)
CREATININE: 0.95 mg/dL (ref 0.61–1.24)
Chloride: 105 mmol/L (ref 101–111)
GFR calc Af Amer: >60 mL/min (ref >60)
GFR calc non Af Amer: >60 mL/min (ref >60)
GLUCOSE: 89 mg/dL (ref 65–99)
Potassium: 4.3 mmol/L (ref 3.5–5.1)
Sodium: 139 mmol/L (ref 135–145)

## 2017-03-05 LAB — CBC
HCT: 40.5 % (ref 39.0–52.0)
Hemoglobin: 13.7 g/dL (ref 13.0–17.0)
MCH: 28.7 pg (ref 26.0–34.0)
MCHC: 33.8 g/dL (ref 30.0–36.0)
MCV: 84.7 fL (ref 78.0–100.0)
PLATELETS: 450 10*3/uL — AB (ref 150–400)
RBC: 4.78 MIL/uL (ref 4.22–5.81)
RDW: 12.7 % (ref 11.5–15.5)
WBC: 9 10*3/uL (ref 4.0–10.5)

## 2017-03-05 LAB — I-STAT TROPONIN, ED: TROPONIN I, POC: 0.01 ng/mL (ref 0.00–0.08)

## 2017-03-05 NOTE — ED Triage Notes (Signed)
Pt reports central CP onset earlier this evening while at work, non radiating, 10/10. States pain is worse with movement. Denies SOB, N/V, dizziness, ligthheadedness, etc.

## 2017-03-06 MED ORDER — NAPROXEN 500 MG PO TABS: 500.0000 mg | ORAL_TABLET | Freq: Two times a day (BID) | ORAL | 0 refills | Status: DC

## 2017-03-06 NOTE — ED Provider Notes (Signed)
[facility] EMERGENCY DEPARTMENT Provider Note   CSN: [id number] Arrival date & time: 03/05/17  1900     History   Chief Complaint Chief Complaint  Patient presents with  . Chest Pain    HPI Ryan Orr is a 27 y.o. male.  The history is provided by the patient and medical records.  Chest Pain      27 y.o. M with no significant past medical history presenting to the ED with midsternal chest discomfort that began early this morning.  Reports pain is been constant, ongoing all day.  Remains localized to midsternal region without radiation.  Pain is worse with movement such as leaning forward or moving his arms.  He denies any shortness of breath, diaphoresis, nausea, vomiting, or dizziness.  He has no known cardiac history.  He is an occasional smoker.  No family cardiac history.  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  History reviewed. No pertinent past medical history.  There are no active problems to display for this patient.   History reviewed. No pertinent surgical history.     Home Medications    Prior to Admission medications   Medication Sig Start Date End Date Taking? Authorizing Provider  neomycin-polymyxin-hydrocortisone (CORTISPORIN) 3.5-10000-1 otic suspension Place 4 drops into both ears 4 (four) times daily. X 7 days 01/14/15   Dione Booze, MD    Family History No family history on file.  Social History Social History   Tobacco Use  . Smoking status: Former Games developer  . Smokeless tobacco: Current User    Types: Snuff  Substance Use Topics  . Alcohol use: Yes    Comment: socially  . Drug use: No     Allergies   Patient has no known allergies.   Review of Systems Review of Systems  Cardiovascular: Positive for chest pain.  All other systems reviewed and are negative.    Physical Exam Updated Vital Signs BP (!) 157/81 (BP  Location: Right Arm)   Pulse 77   Temp 98.6 F (37 C) (Oral)   Resp 19   Ht 5\' 10"  (1.778 m)   Wt 117.9 kg (260 lb)   SpO2 100%   BMI 37.31 kg/m   Physical Exam  Constitutional: He is oriented to person, place, and time. He appears well-developed and well-nourished.  HENT:  Head: Normocephalic and atraumatic.  Mouth/Throat: Oropharynx is clear and moist.  Eyes: Conjunctivae and EOM are normal. Pupils are equal, round, and reactive to light.  Neck: Normal range of motion.  Cardiovascular: Normal rate, regular rhythm and normal heart sounds.  Pulmonary/Chest: Effort normal and breath sounds normal. He has no wheezes. He has no rhonchi.  Tenderness along the midsternal region without any noted deformity  Abdominal: Soft. Bowel sounds are normal.  Musculoskeletal: Normal range of motion.  Neurological: He is alert and oriented to person, place, and time.  Skin: Skin is warm and dry.  Psychiatric: He has a normal mood and affect.  Nursing note and vitals reviewed.    ED Treatments / Results  Labs (all labs ordered are listed, but only abnormal results are displayed) Labs Reviewed  CBC - Abnormal; Notable for the following components:      Result Value   Platelets 450 (*)    All other components within normal limits  BASIC METABOLIC PANEL  I-STAT TROPONIN, ED    EKG  EKG Interpretation  Date/Time:  Tuesday March 05 2017 19:04:49 EST Ventricular Rate:  101 PR Interval:  154 QRS Duration: 80 QT Interval:  348 QTC Calculation: 451 R Axis:   -12 Text Interpretation:  Sinus tachycardia Otherwise normal ECG No previous ECGs available Confirmed by Glynn Octaveancour, Stephen [phone]([id number]) on 03/05/2017 11:18:22 PM       Radiology Dg Chest 2 View  Result Date: 03/05/2017 CLINICAL DATA:  Central chest pain with painful inspiration beginning earlier today. EXAM: CHEST  2 VIEW COMPARISON:  None. FINDINGS: The heart size and mediastinal contours are within normal limits. Both lungs are  clear. No pleural effusion or pneumothorax. The visualized skeletal structures are unremarkable. IMPRESSION: Normal chest radiographs. Electronically Signed   By: Amie Portlandavid  Ormond M.D.   On: 03/05/2017 20:30    Procedures Procedures (including critical care time)  Medications Ordered in ED Medications - No data to display   Initial Impression / Assessment and Plan / ED Course  I have reviewed the triage vital signs and the nursing notes.  Pertinent labs & imaging results that were available during my care of the patient were reviewed by me and considered in my medical decision making (see chart for details).  27 year old male here with midsternal chest pain, constant ongoing throughout the day.  No known cardiac history.  EKG without acute ischemic changes.  Screening labs are overall reassuring.  Chest x-ray is clear.  Patient's pain is reproducible on exam.  Suspect this is likely musculoskeletal given the physical demands of his job.  Will start anti-inflammatories.  Does not have PCP, will refer to wellness clinic.  Discussed plan with patient, he acknowledged understanding and agreed with plan of care.  Return precautions given for new or worsening symptoms.  Final Clinical Impressions(s) / ED Diagnoses   Final diagnoses:  Atypical chest pain    ED Discharge Orders        Ordered    naproxen (NAPROSYN) 500 MG tablet  2 times daily with meals     03/06/17 0014       Garlon HatchetSanders, Rylea Selway M, PA-C 03/06/17 0200    Garlon HatchetSanders, Daltin Crist M, PA-C 03/06/17 0200    Zadie RhineWickline, Donald, MD 03/06/17 [phone]

## 2017-03-06 NOTE — Discharge Instructions (Signed)
Take the prescribed medication as directed. Follow-up with your primary care doctor.  If you do not have one, you can see the wellness clinic. Return to the ED for new or worsening symptoms.

## 2020-01-29 IMAGING — CR DG CHEST 2V
2 series · 2 of 2 positions shown · non-contrast
Comparison: None.

CLINICAL DATA: Central chest pain with painful inspiration
beginning earlier today.

EXAM:
CHEST  2 VIEW

[chest lat]
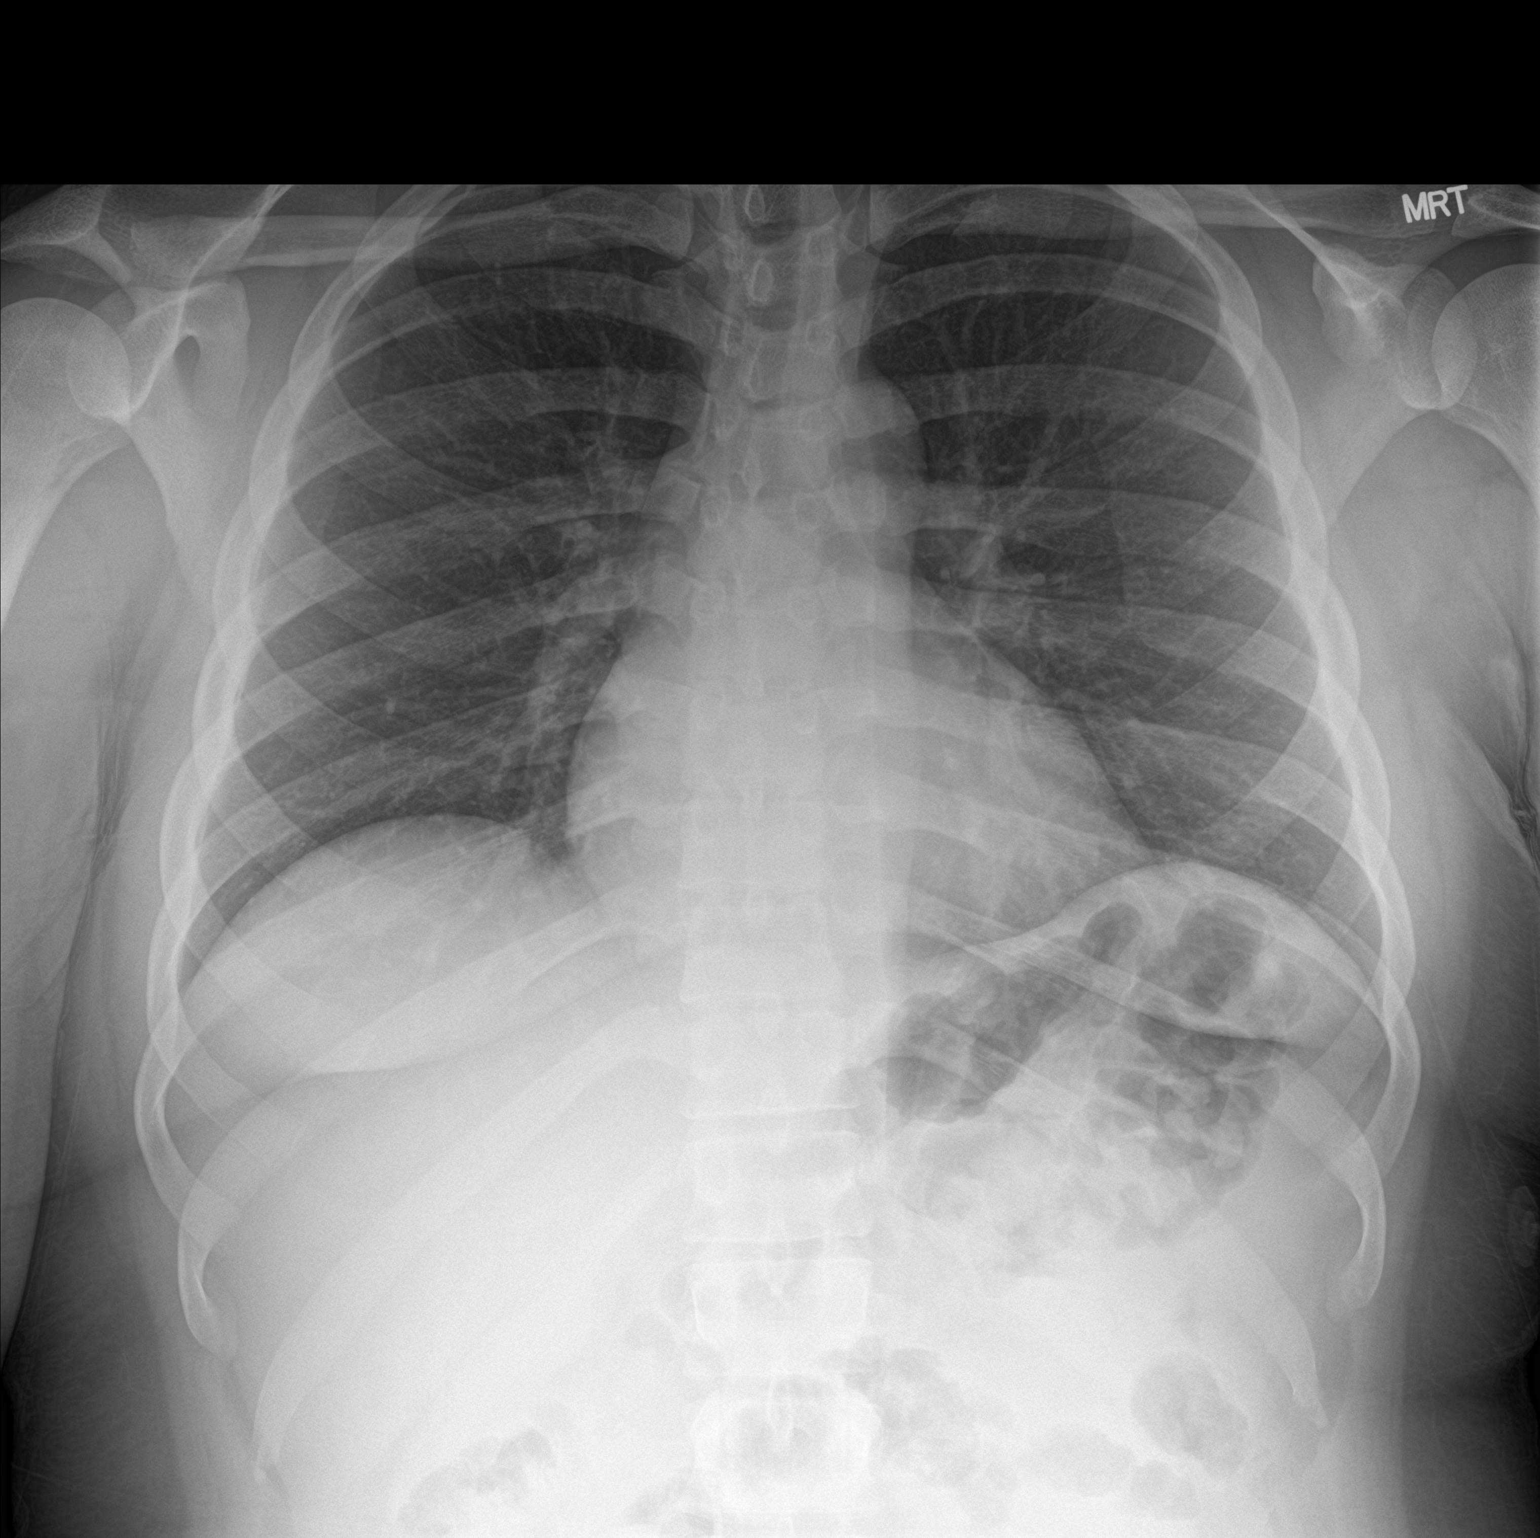

[chest ap]
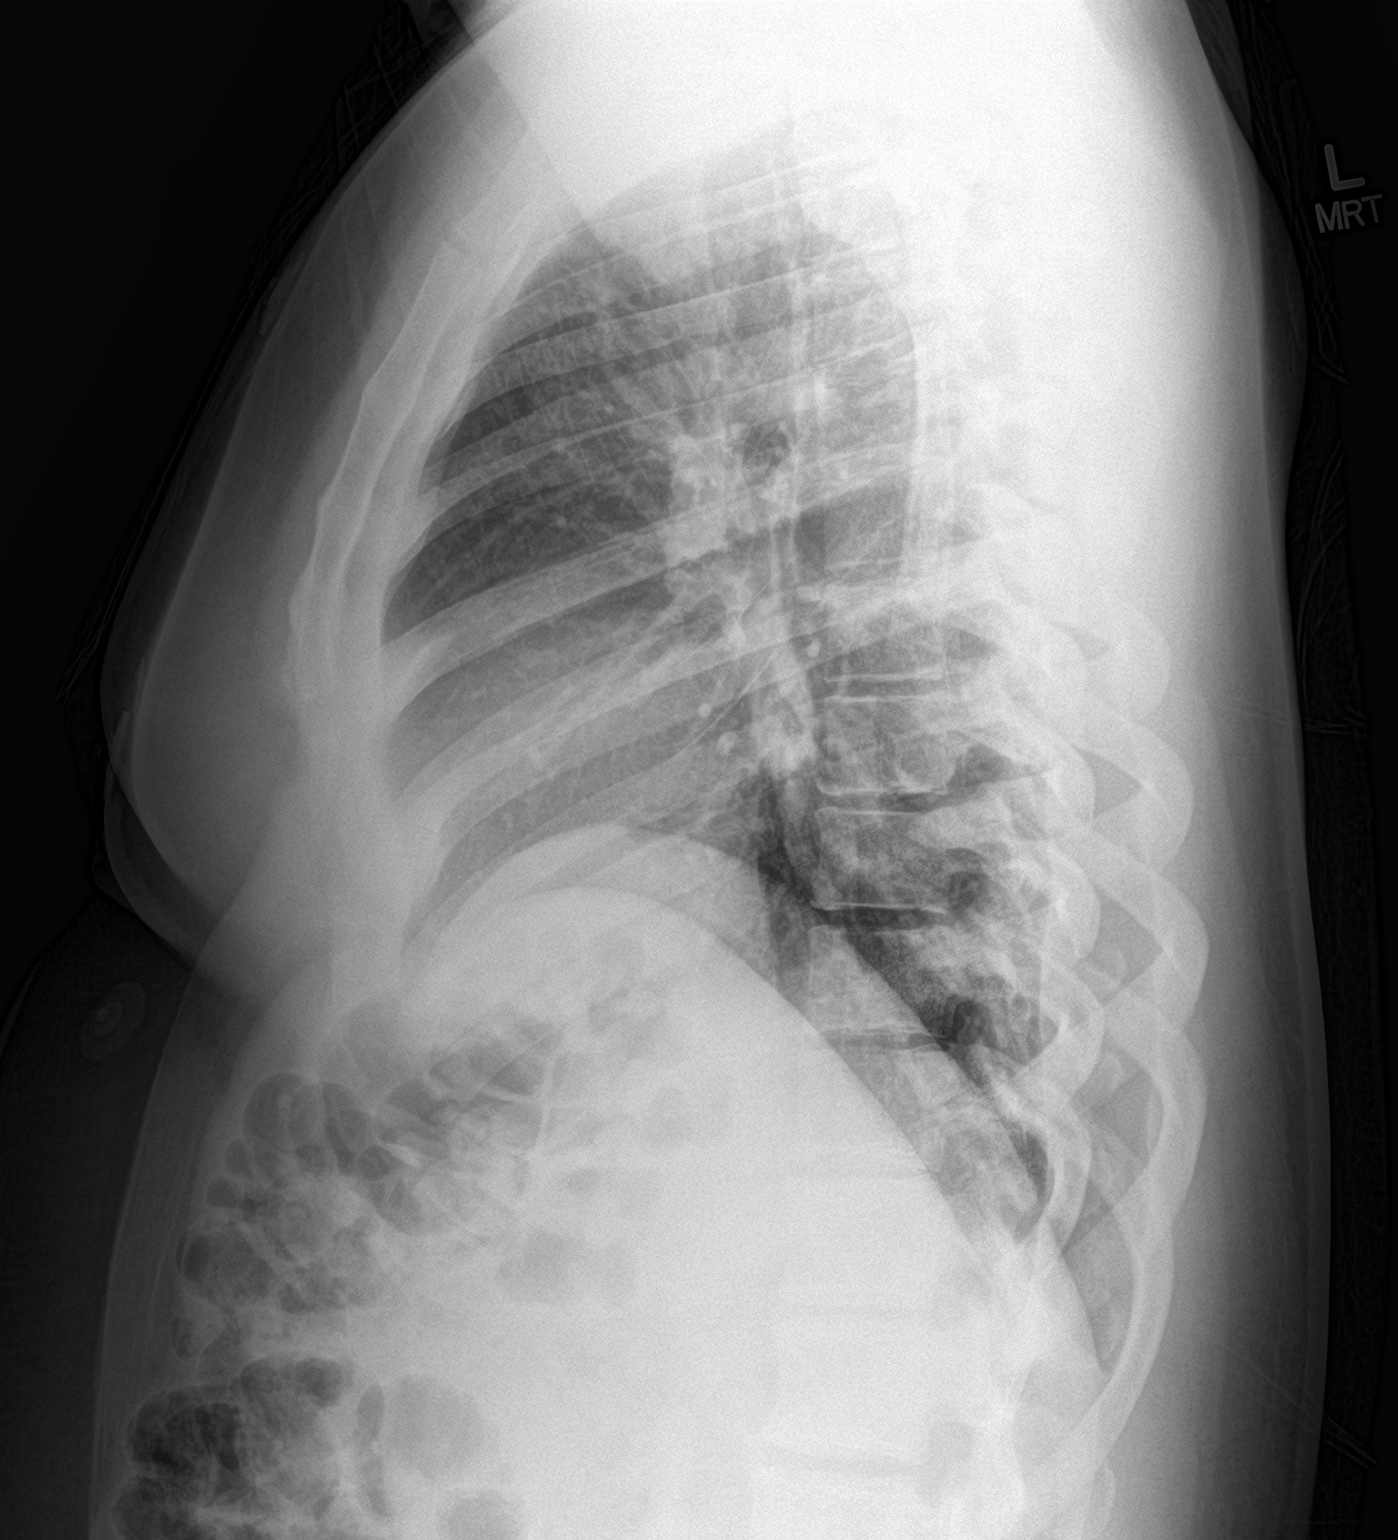

[2 of 2 positions shown; findings below may reference images not displayed]

FINDINGS: The heart size and mediastinal contours are within normal limits.
Both lungs are clear. No pleural effusion or pneumothorax. The
visualized skeletal structures are unremarkable.
IMPRESSION: Normal chest radiographs.

## 2021-08-28 ENCOUNTER — Ambulatory Visit (HOSPITAL_COMMUNITY)
Admission: RE | Admit: 2021-08-28 | Discharge: 2021-08-28 | Disposition: A | Discharge status: Home / Self Care | Payer: Medicaid Other | Source: Ambulatory Visit | Attending: Physician Assistant | Admitting: Physician Assistant

## 2021-08-28 ENCOUNTER — Telehealth (HOSPITAL_COMMUNITY): Payer: Self-pay | Admitting: Physician Assistant

## 2021-08-28 ENCOUNTER — Encounter (HOSPITAL_COMMUNITY): Payer: Self-pay

## 2021-08-28 VITALS — BP 128/79 | HR 96 | Temp 98.3°F | Resp 18

## 2021-08-28 DIAGNOSIS — H6123 Impacted cerumen, bilateral: Secondary | ICD-10-CM

## 2021-08-28 DIAGNOSIS — H6122 Impacted cerumen, left ear: Secondary | ICD-10-CM

## 2021-08-28 DIAGNOSIS — H61892 Other specified disorders of left external ear: Secondary | ICD-10-CM

## 2021-08-28 DIAGNOSIS — H9202 Otalgia, left ear: Secondary | ICD-10-CM

## 2021-08-28 MED ORDER — CARBAMIDE PEROXIDE 6.5 % OT SOLN: 5.0000 [drp] | Freq: Two times a day (BID) | OTIC | 0 refills | Status: AC

## 2021-08-28 MED ORDER — CIPROFLOXACIN HCL 0.2 % OT SOLN: 0.2000 mL | Freq: Two times a day (BID) | OTIC | 0 refills | Status: DC

## 2021-08-28 MED ORDER — OFLOXACIN 0.3 % OT SOLN: 5.0000 [drp] | Freq: Two times a day (BID) | OTIC | 0 refills | Status: DC

## 2021-08-28 NOTE — Telephone Encounter (Signed)
Patient contacted clinic indicating that he could not afford ofloxacin drops.  Ciprofloxacin drops sent to pharmacy and placed in the hopes this would be more affordable.

## 2021-08-28 NOTE — Discharge Instructions (Addendum)
Start Debrox twice daily in both ears to help soften the wax and encourage it to drain on its own.  I do recommend that he follow-up with an ENT if this is not improving.  Please call to schedule an appointment.  Do not use Q-tips or anything else in the ear.  The left ear canal looks irritated.  Please use ofloxacin drops twice daily.  Take Tylenol ibuprofen for pain.  If anything worsens return for reevaluation.

## 2021-08-28 NOTE — ED Triage Notes (Signed)
Pt c/o lt ear clogged and painful for past 3 days. States has not taking anything for pain.

## 2021-08-28 NOTE — ED Provider Notes (Signed)
[facility]    CSN: [id number] Arrival date & time: 08/28/21  2778      History   Chief Complaint Chief Complaint  Patient presents with   Otalgia    HPI Ryan Orr is a 31 y.o. male.   Patient presents today with a several day history of left otalgia.  Reports that he had a feeling of fullness and started using Q-tip and finger to try to remove wax.  Since that time he has had worsening pain.  Pain is currently rated 7 on a 0-10 pain scale, described as throbbing, no aggravating or alleviating factors identified.  He has not tried any over-the-counter medication for symptom management.  Denies any recent URI or additional symptoms including cough, congestion, fever, nausea, vomiting.  Denies any recent antibiotic use.  Denies any recent airplane travel or potential barotrauma.  Denies any recent swimming.  He does have a history of cerumen impaction and reports current symptoms are similar to previous episodes of this condition.    History reviewed. No pertinent past medical history.  There are no problems to display for this patient.   History reviewed. No pertinent surgical history.     Home Medications    Prior to Admission medications   Medication Sig Start Date End Date Taking? Authorizing Provider  carbamide peroxide (DEBROX) 6.5 % OTIC solution Place 5 drops into both ears 2 (two) times daily. 08/28/21  Yes Charliee Krenz K, PA-C  ofloxacin (FLOXIN) 0.3 % OTIC solution Place 5 drops into the left ear 2 (two) times daily. 08/28/21  Yes Tywanda Rice, Noberto Retort, PA-C    Family History History reviewed. No pertinent family history.  Social History Social History   Tobacco Use   Smoking status: Former   Smokeless tobacco: Current    Types: Snuff  Substance Use Topics   Alcohol use: Yes    Comment: socially   Drug use: No     Allergies   Patient has no known allergies.   Review of Systems Review of Systems  Constitutional:  Positive for activity  change. Negative for appetite change, fatigue and fever.  HENT:  Positive for ear pain and hearing loss. Negative for congestion, ear discharge, sinus pressure, sneezing and sore throat.   Respiratory:  Negative for cough and shortness of breath.   Cardiovascular:  Negative for chest pain.  Gastrointestinal:  Negative for abdominal pain, diarrhea, nausea and vomiting.  Neurological:  Negative for dizziness, light-headedness and headaches.     Physical Exam Triage Vital Signs ED Triage Vitals [08/28/21 0906]  Enc Vitals Group     BP 128/79     Pulse Rate 96     Resp 18     Temp 98.3 F (36.8 C)     Temp Source Oral     SpO2 96 %     Weight      Height      Head Circumference      Peak Flow      Pain Score 7     Pain Loc      Pain Edu?      Excl. in GC?    No data found.  Updated Vital Signs BP 128/79 (BP Location: Left Arm)   Pulse 96   Temp 98.3 F (36.8 C) (Oral)   Resp 18   SpO2 96%   Visual Acuity Right Eye Distance:   Left Eye Distance:   Bilateral Distance:    Right Eye Near:   Left  Eye Near:    Bilateral Near:     Physical Exam Vitals reviewed.  Constitutional:      General: He is awake.     Appearance: Normal appearance. He is well-developed. He is not ill-appearing.     Comments: Very pleasant male appears stated age in no acute distress sitting comfortably in exam room  HENT:     Head: Normocephalic and atraumatic.     Right Ear: External ear normal. There is impacted cerumen.     Left Ear: External ear normal. There is impacted cerumen.     Ears:     Comments: Bilateral cerumen impaction noted; unable to remove completely with in office irrigation.    Nose: Nose normal.     Mouth/Throat:     Pharynx: Uvula midline. No oropharyngeal exudate, posterior oropharyngeal erythema or uvula swelling.  Cardiovascular:     Rate and Rhythm: Normal rate and regular rhythm.     Heart sounds: Normal heart sounds, S1 normal and S2 normal. No murmur  heard. Pulmonary:     Effort: Pulmonary effort is normal. No accessory muscle usage or respiratory distress.     Breath sounds: Normal breath sounds. No stridor. No wheezing, rhonchi or rales.     Comments: Clear to auscultation bilaterally Neurological:     Mental Status: He is alert.  Psychiatric:        Behavior: Behavior is cooperative.      UC Treatments / Results  Labs (all labs ordered are listed, but only abnormal results are displayed) Labs Reviewed - No data to display  EKG   Radiology No results found.  Procedures Procedures (including critical care time)  Medications Ordered in UC Medications - No data to display  Initial Impression / Assessment and Plan / UC Course  I have reviewed the triage vital signs and the nursing notes.  Pertinent labs & imaging results that were available during my care of the patient were reviewed by me and considered in my medical decision making (see chart for details).     Attempted irrigation in clinic with only minimal improvement of symptoms.  Patient was started on Debrox to help soften wax and encourage drainage.  Discussed that if he continues to have discomfort he should follow-up with an ENT as they have different tools that could help resolve cerumen impaction.  He was given contact information of local provider with instruction to call to schedule an appointment.  Discouraged the use of Q-tips, earbuds, earplugs.  He was started on ofloxacin given irritation of left ear canal to cover for otitis externa.  He can use over-the-counter medications including Tylenol ibuprofen for pain relief.  Discussed that if he has any worsening symptoms he needs to be seen immediately.  Strict return precautions given.  Work excuse note provided.  Final Clinical Impressions(s) / UC Diagnoses   Final diagnoses:  Bilateral impacted cerumen  Acute otalgia, left  Hearing loss of left ear due to cerumen impaction  Irritation of external ear  canal, left     Discharge Instructions      Start Debrox twice daily in both ears to help soften the wax and encourage it to drain on its own.  I do recommend that he follow-up with an ENT if this is not improving.  Please call to schedule an appointment.  Do not use Q-tips or anything else in the ear.  The left ear canal looks irritated.  Please use ofloxacin drops twice daily.  Take Tylenol ibuprofen  for pain.  If anything worsens return for reevaluation.     ED Prescriptions     Medication Sig Dispense Auth. Provider   carbamide peroxide (DEBROX) 6.5 % OTIC solution Place 5 drops into both ears 2 (two) times daily. 15 mL Wayman Hoard K, PA-C   ofloxacin (FLOXIN) 0.3 % OTIC solution Place 5 drops into the left ear 2 (two) times daily. 5 mL Deamber Buckhalter K, PA-C      PDMP not reviewed this encounter.   Jeani Hawking, PA-C 08/28/21 1009

## 2021-08-29 ENCOUNTER — Telehealth (HOSPITAL_COMMUNITY): Payer: Self-pay | Admitting: Emergency Medicine

## 2021-08-29 MED ORDER — NEOMYCIN-POLYMYXIN-HC 3.5-10000-1 OT SUSP: 4.0000 [drp] | Freq: Three times a day (TID) | OTIC | 0 refills | Status: AC

## 2021-08-29 NOTE — Telephone Encounter (Signed)
Received call from patient's family member stating they are unable to afford his new prescription either.  When I look it up on [facility] the cost is >$300 with discount.  Awaiting provider review for further guidance.

## 2023-09-17 ENCOUNTER — Ambulatory Visit (HOSPITAL_COMMUNITY)
Admission: EM | Admit: 2023-09-17 | Discharge: 2023-09-17 | Disposition: A | Discharge status: Home / Self Care | Payer: Worker's Compensation | Attending: Nurse Practitioner | Admitting: Nurse Practitioner

## 2023-09-17 ENCOUNTER — Encounter (HOSPITAL_COMMUNITY): Payer: Self-pay | Admitting: Emergency Medicine

## 2023-09-17 DIAGNOSIS — Z23 Encounter for immunization: Secondary | ICD-10-CM | POA: Diagnosis not present

## 2023-09-17 DIAGNOSIS — S61012A Laceration without foreign body of left thumb without damage to nail, initial encounter: Secondary | ICD-10-CM | POA: Diagnosis not present

## 2023-09-17 MED ORDER — TETANUS-DIPHTH-ACELL PERTUSSIS 5-2.5-18.5 LF-MCG/0.5 IM SUSY
0.5000 mL | PREFILLED_SYRINGE | Freq: Once | INTRAMUSCULAR | Status: AC
  Administered 2023-09-17 (×2): 0.5 mL via INTRAMUSCULAR

## 2023-09-17 MED ORDER — TETANUS-DIPHTH-ACELL PERTUSSIS 5-2.5-18.5 LF-MCG/0.5 IM SUSY
PREFILLED_SYRINGE | INTRAMUSCULAR | Status: AC
  Filled 2023-09-17: qty 0.5

## 2023-09-17 MED ORDER — LIDOCAINE-EPINEPHRINE 1 %-1:100000 IJ SOLN
INTRAMUSCULAR | Status: AC
  Filled 2023-09-17: qty 1

## 2023-09-17 NOTE — ED Triage Notes (Signed)
 Pt works at [employer] and cut left finger with a knife. Pt has bandaged in triage controlling the bleeding.

## 2023-09-17 NOTE — ED Provider Notes (Signed)
 [facility]    CSN: [id number] Arrival date & time: 09/17/23  1735      History   Chief Complaint Chief Complaint  Patient presents with   Laceration    HPI Ryan Orr is a 33 y.o. male.   Patient presents for evaluation of a laceration he sustained to his left thumb just prior to arrival.  Patient was using a knife to open something when it slipped and impacted his left thumb.  Bleeding controlled on arrival with direct pressure.  He is right hand dominant.  Unknown last Tetanus immunization status (record review indicates 09/30/2014).  The history is provided by the patient.  Laceration Associated symptoms: no fever     History reviewed. No pertinent past medical history.  There are no active problems to display for this patient.   History reviewed. No pertinent surgical history.     Home Medications    Prior to Admission medications   Medication Sig Start Date End Date Taking? Authorizing Provider  carbamide peroxide (DEBROX) 6.5 % OTIC solution Place 5 drops into both ears 2 (two) times daily. 08/28/21   Raspet, Erin K, PA-C  neomycin -polymyxin-hydrocortisone (CORTISPORIN) 3.5-10000-1 OTIC suspension Place 4 drops into both ears 3 (three) times daily. 08/29/21   Rolinda Rogue, MD    Family History No family history on file.  Social History Social History   Tobacco Use   Smoking status: Former   Smokeless tobacco: Current    Types: Snuff  Substance Use Topics   Alcohol use: Yes    Comment: socially   Drug use: No     Allergies   Patient has no known allergies.   Review of Systems Review of Systems  Constitutional:  Negative for chills and fever.  Respiratory:  Negative for cough and shortness of breath.   Cardiovascular:  Negative for chest pain.  Musculoskeletal:  Negative for arthralgias and myalgias.  Skin:  Positive for wound.       Laceration left thumb.  Neurological:  Negative for dizziness and headaches.   Psychiatric/Behavioral:  The patient is nervous/anxious.      Physical Exam Triage Vital Signs ED Triage Vitals  Encounter Vitals Group     BP 09/17/23 1820 133/89     Girls Systolic BP Percentile --      Girls Diastolic BP Percentile --      Boys Systolic BP Percentile --      Boys Diastolic BP Percentile --      Pulse Rate 09/17/23 1820 99     Resp 09/17/23 1820 17     Temp 09/17/23 1820 98.3 F (36.8 C)     Temp Source 09/17/23 1820 Oral     SpO2 09/17/23 1820 96 %     Weight --      Height --      Head Circumference --      Peak Flow --      Pain Score 09/17/23 1818 10     Pain Loc --      Pain Education --      Exclude from Growth Chart --    No data found.  Updated Vital Signs BP 133/89 (BP Location: Right Arm)   Pulse 99   Temp 98.3 F (36.8 C) (Oral)   Resp 17   SpO2 96%   Physical Exam Vitals and nursing note reviewed.  Cardiovascular:     Rate and Rhythm: Normal rate and regular rhythm.     Heart sounds: Normal  heart sounds.  Pulmonary:     Effort: Pulmonary effort is normal.     Breath sounds: Normal breath sounds.  Abdominal:     General: Bowel sounds are normal.  Skin:    General: Skin is warm and dry.     Comments: Documentation by exception: Left thumb There is an approximate 2.5 cm horizontal partial thickness laceration across the distal thumb (palmar surface).  No foreign body noted.  Retains full strength and range of motion (flexion/extension).  Neurovascular status is intact distally.  Bleeding controlled.  Neurological:     General: No focal deficit present.     Mental Status: He is oriented to person, place, and time.  Psychiatric:        Mood and Affect: Mood normal.        Behavior: Behavior normal.        Thought Content: Thought content normal.    UC Treatments / Results  Labs (all labs ordered are listed, but only abnormal results are displayed) Labs Reviewed - No data to display  EKG   Radiology No results  found.  Procedures Laceration Repair  Date/Time: 09/17/2023 7:27 PM  Performed by: Janet Therisa PARAS, FNP Authorized by: Janet Therisa PARAS, FNP   Consent:    Consent obtained:  Verbal   Consent given by:  Patient Universal protocol:    Procedure explained and questions answered to patient or proxy's satisfaction: yes     Patient identity confirmed:  Verbally with patient Anesthesia:    Anesthesia method:  Local infiltration   Local anesthetic:  Lidocaine  1% WITH epi Laceration details:    Location:  Finger   Finger location:  L thumb   Wound length (cm): Approximately 2.5 cm. Pre-procedure details:    Preparation:  Patient was prepped and draped in usual sterile fashion Exploration:    Contaminated: no   Treatment:    Area cleansed with:  Povidone-iodine and chlorhexidine   Amount of cleaning:  Standard   Irrigation solution:  Sterile water   Irrigation method:  Syringe   Debridement:  None Skin repair:    Repair method:  Sutures   Suture size:  4-0   Suture material:  Prolene   Suture technique:  Simple interrupted   Number of sutures: 5. Approximation:    Approximation:  Close Repair type:    Repair type:  Simple Post-procedure details:    Dressing:  Non-adherent dressing   Procedure completion:  Tolerated well, no immediate complications Comments:     Betadine wash/dry pre and post procedure.  (including critical care time)  Medications Ordered in UC Medications  Tdap (BOOSTRIX ) injection 0.5 mL (has no administration in time range)    Initial Impression / Assessment and Plan / UC Course  I have reviewed the triage vital signs and the nursing notes.  Pertinent labs & imaging results that were available during my care of the patient were reviewed by me and considered in my medical decision making (see chart for details).    Patient presented for evaluation of a laceration he sustained to his left thumb just prior to arrival.  Wound was satisfactorily approximated  with 5 sutures of 4.0 Prolene.  Wound hemostatic upon procedure completion.  Daily wound care was reviewed in depth.  Tetanus immunization was updated.  Patient to return in 7 days for suture removal.  Red flags reviewed which would warrant return evaluation.  No indication for post laceration repair antibiotic prophylaxis. Final Clinical Impressions(s) / UC Diagnoses  Final diagnoses:  Laceration of left thumb without foreign body without damage to nail, initial encounter     Discharge Instructions      Keep left thumb clean and dry.  Covered when out in public, open to air when at home. May use Tylenol /Motrin as needed for residual discomfort. Return in 7 days for re-evaluation and suture removal.    ED Prescriptions   None    PDMP not reviewed this encounter.   Janet Therisa PARAS, FNP 09/17/23 1931

## 2023-09-17 NOTE — Discharge Instructions (Signed)
 Keep left thumb clean and dry.  Covered when out in public, open to air when at home. May use Tylenol /Motrin as needed for residual discomfort. Return in 7 days for re-evaluation and suture removal.

## 2023-09-17 NOTE — ED Triage Notes (Signed)
 Pt Lac assessed by Provider and dsy applied. Pt returned to front lobby.

## 2023-09-27 ENCOUNTER — Ambulatory Visit (HOSPITAL_COMMUNITY)
Admission: RE | Admit: 2023-09-27 | Discharge: 2023-09-27 | Discharge status: Against Medical Advice | Payer: Worker's Compensation | Source: Ambulatory Visit
